# Patient Record
Sex: Female | Born: 1955 | Race: White | Hispanic: No | Smoking: Current every day smoker
Health system: Southern US, Community
[De-identification: ages and names within clinical notes are randomized; demographics above are authoritative.]

## PROBLEM LIST (undated history)

## (undated) HISTORY — PX: VAGINAL HYSTERECTOMY: SUR661

## (undated) HISTORY — PX: CYSTECTOMY: SUR359

## (undated) HISTORY — PX: BLADDER REPAIR: SHX76

---

## 2005-01-20 ENCOUNTER — Ambulatory Visit: Payer: Self-pay | Admitting: Infectious Diseases

## 2005-01-20 ENCOUNTER — Inpatient Hospital Stay (HOSPITAL_COMMUNITY): Admission: AD | Admit: 2005-01-20 | Discharge: 2005-01-25 | Payer: Self-pay | Admitting: Orthopedic Surgery

## 2007-02-06 ENCOUNTER — Encounter (INDEPENDENT_AMBULATORY_CARE_PROVIDER_SITE_OTHER): Payer: Self-pay | Admitting: Orthopedic Surgery

## 2007-02-06 ENCOUNTER — Ambulatory Visit (HOSPITAL_BASED_OUTPATIENT_CLINIC_OR_DEPARTMENT_OTHER): Admission: RE | Admit: 2007-02-06 | Discharge: 2007-02-06 | Payer: Self-pay | Admitting: Orthopedic Surgery

## 2010-11-02 NOTE — Op Note (Signed)
NAME:  Laurie Dean, Laurie Dean                ACCOUNT NO.:  [id number]   MEDICAL RECORD NO.:  [id number]          PATIENT TYPE:  AMB   LOCATION:  [facility]                         FACILITY:  [facility]   PHYSICIAN:  Deidre Ala, M.D.    DATE OF BIRTH:  Mar 09, 1956   DATE OF PROCEDURE:  02/06/2007  DATE OF DISCHARGE:                               OPERATIVE REPORT   PREOPERATIVE DIAGNOSIS:  Large anterolateral foot ganglion cyst.   POSTOPERATIVE DIAGNOSIS:  Large anterolateral foot ganglion cyst, down  to short intrinsics and tendon sheath and the capsule measuring 2 x 3 x  2 cm.   OPERATION:  Excision ganglion cyst, left foot.   SURGEON:  Doristine Section, M.D.   ASSISTANT:  Phineas Semen, P.A.   ANESTHESIA:  General with LMA.   CULTURES:  None.   DRAINS:  None.   ESTIMATED BLOOD LOSS:  Minimal.   TOURNIQUET TIME:  28 minutes.   PATHOLOGIC FINDINGS AND HISTORY:  Laurie Dean has had a complicated  history with this ganglion cyst.  She had it lanced in [place] by Dr.  Douglass Rivers.  It got infected with MRSA.  Dr. Alveda Reasons treated her in our  office with antibiotics in the hospital and it resolved.  The last  clinic note says at some point she may wish to have the lump excised  and that will be strictly up to her there is no sign that this anything  but benign.  When I saw her January 12, 2007 there was a large  transilluminating ganglion cyst and with anterolateral proximal tarsus.  It was firm, very prominent.  It appeared to be a ganglion cyst with no  sign of infection.  The x-rays were negative.  Ganglion cyst measured at  that point 3 x 3 x 2 cm.  We discussed preoperative time-out of work.  She wanted it removed and we told her we would give her IV vancomycin  perioperatively for prophylaxis and doxycycline for short course postop  which she had had in the hospital.  She will probably have to be were  for 7 to 10 days because she walks all the time.  I told her dependent  how the wound edges  healed, this would be set up in the near future.  At  surgery the ganglion was only slightly smaller, extremely tense up in  between the lateral extensor digitorum communis to the fifth toe.  There  were some short intrinsics below it, some muscle and tendon that the  ganglion was coming off.  There was a discrete cystic mass wall,  proximal and distal down to bone with proximal and distal stalk.  We got  the cyst wall out in its entirety and then cauterized the bed to prevent  recurrence.  On entrance with the skin incision there was no  subcutaneous tissue and it was very, very tense.  We encountered a small  rent in the superficial peroneal branch.  The branch itself measured  about 2 mm.  This was a 1 mm, one fascicle that I sutured with 6-0 nylon  just to  prevent neuroma.  I elected not to resect it more proximal and  did this just for prophylaxis for neuroma formation.  Three 6-0 sutures  were placed under loupe magnification with a Castroviejo needle holder  just to reapproximate it again to prevent neuroma formation in this one  fascicle of this very tiny branch.  Epidural suture was done.   PROCEDURE:  With adequate anesthesia obtained using LMA technique, 1  gram vancomycin given IV prophylaxis, the patient was placed in the  supine position.  The left foot was prepped from toes to the midcalf in  the standard fashion.  After standard prepping and draping, Esmarch  examination was used.  The tourniquet was let up to 350 mmHg.  There was  already a transverse incision somewhat into the fibers of the extensor  retinaculum and this was opened obliquely transversely and extended.  Incision was deepened sharply with a knife and hemostasis obtained using  the Bovie electro coagulator.  The ganglion was so superficial and so  tense, it immediately leaked its ganglion fluid.  We expressed it out  and then carefully dissected out the cyst wall down past the lateral  side of the extensor  digitorum to its bed along the short intrinsics and  excised it fully and sent the cyst wall for specimen.  I then cauterized  the bed.  I noted this one fascicle, repaired it and irrigated.  I then  closed the wound with a 4-0 Monocryl subcuticular with Steri-Strips.  0.5% Marcaine plain was injected proximal in a field block and a bulky  sterile compressive dressing was applied with Ace with a wooden sole  fracture shoe.  The patient having tolerated procedure well was  awakened, taken to recovery room in satisfactory condition to be  discharged per outpatient routine, given Percocet for pain and told call  the office for recheck on Monday.  She was to elevate it and continue  doxycycline 100 mg p.o. b.i.d. for 10 days.           ______________________________  V. Charlesetta Shanks, M.D.     VEP/MEDQ  D:  02/06/2007  T:  02/07/2007  Job:  [id number]   cc:   Laurie Severe, MD  Fax: [phone]

## 2010-11-05 NOTE — Discharge Summary (Signed)
Laurie Dean, Laurie Dean                ACCOUNT NO.:  [id number]   MEDICAL RECORD NO.:  [id number]          PATIENT TYPE:  INP   LOCATION:  3027                         FACILITY:  [facility]   PHYSICIAN:  Nelda Severe, MD      DATE OF BIRTH:  [birth year]   DATE OF ADMISSION:  01/20/2005  DATE OF DISCHARGE:  01/25/2005                                 DISCHARGE SUMMARY   This woman was admitted for management of soft tissue infection at the  dorsum of her left foot which had not responded to treatment with oral  antibiotics prior to admission. She originally treated was by a doctor in  [place], [place]. She was admitted and Dr. Ninetta Lights, infectious  disease specialist, consulted. She was placed on vancomycin intravenously.  There was good clinical response. There has never been a positive culture.  At the present time there is no erythema remaining. She still has a small  swollen area, presumably a ganglion cyst which has been in existence for the  last couple of years. At the present time Dr. Ninetta Lights has recommended  discharging her on Bactrim DS one b.i.d. She will be reviewed in the office  in one week's time or sooner if necessary.   FINAL DIAGNOSIS:  Soft tissue infection, left foot.   DISPOSITION:  Home as tolerated with TED hose to secure her dressing.   DISCHARGE MEDICATIONS:  Bactrim DS one b.i.d.   FOLLOWUP:  In the office in one week.   CONDITION ON DISCHARGE:  Improved.      Nelda Severe, MD  Electronically Signed     MT/MEDQ  D:  01/25/2005  T:  01/26/2005  Job:  [id number]

## 2011-04-01 LAB — POCT HEMOGLOBIN-HEMACUE: Operator id: 268271

## 2015-03-11 ENCOUNTER — Ambulatory Visit (INDEPENDENT_AMBULATORY_CARE_PROVIDER_SITE_OTHER): Payer: BLUE CROSS/BLUE SHIELD | Admitting: Osteopathic Medicine

## 2015-03-11 ENCOUNTER — Encounter: Payer: Self-pay | Admitting: Osteopathic Medicine

## 2015-03-11 VITALS — BP 129/78 | HR 87 | Ht 66.0 in | Wt 212.0 lb

## 2015-03-11 DIAGNOSIS — R5382 Chronic fatigue, unspecified: Secondary | ICD-10-CM

## 2015-03-11 DIAGNOSIS — Z720 Tobacco use: Secondary | ICD-10-CM | POA: Diagnosis not present

## 2015-03-11 DIAGNOSIS — Z716 Tobacco abuse counseling: Secondary | ICD-10-CM

## 2015-03-11 DIAGNOSIS — G4710 Hypersomnia, unspecified: Secondary | ICD-10-CM

## 2015-03-11 DIAGNOSIS — Z13220 Encounter for screening for lipoid disorders: Secondary | ICD-10-CM | POA: Insufficient documentation

## 2015-03-11 DIAGNOSIS — R400 Somnolence: Secondary | ICD-10-CM | POA: Insufficient documentation

## 2015-03-11 NOTE — Progress Notes (Signed)
HPI: Laurie Dean is a 59 y.o. female who presents to [facility]  today for chief complaint of:  Chief Complaint  Patient presents with  . Fatigue  . Establish Care    . Quality: generalized fatigue . Severity: moderate . Duration: 1 year . Assoc signs/symptoms: no fever/chills, (+) difficulty waking up, daytime somnolence   STOP BANG (+) snore (+) Tired (+) observed stop breathing when asleep (-) HTN nmp (-) BMI <35 (+) age >81 (-) Neck 14.5 (-) Gender = F 4 = Intermediate risk     Past medical, social and family history reviewed: No past medical history on file. Past Surgical History  Procedure Laterality Date  . Vaginal hysterectomy    . Bladder repair    . Cystectomy     Social History  Substance Use Topics  . Smoking status: Current Every Day Smoker -- 0.50 packs/day    Types: Cigarettes  . Smokeless tobacco: Not on file  . Alcohol Use: 0.6 oz/week    1 Standard drinks or equivalent per week   Family History  Problem Relation Age of Onset  . Cancer Father   . Hypertension Mother   . Stroke Mother   . Stroke Brother     Current Outpatient Prescriptions  Medication Sig Dispense Refill  . Cholecalciferol (VITAMIN D-3 PO) Take by mouth.    . cyanocobalamin 500 MCG tablet Take 500 mcg by mouth daily.    . Multiple Vitamin (MULTIVITAMIN) capsule Take 1 capsule by mouth daily.    Marland Kitchen omeprazole (PRILOSEC) 40 MG capsule Take 40 mg by mouth daily.    . Probiotic Product (PROBIOTIC PO) Take by mouth.     No current facility-administered medications for this visit.   Allergies  Allergen Reactions  . Penicillins   . Sulfa Antibiotics       Review of Systems: CONSTITUTIONAL: Neg fever/chills, no unintentional weight changes (+) fatigue and sleep problems HEAD/EYES/EARS/NOSE/THROAT: No headache/vision change or hearing change, no sore throat CARDIAC: No chest pain/pressure/palpitations, no orthopnea RESPIRATORY: No  cough/shortness of breath/wheeze GASTROINTESTINAL: No nausea/vomiting/abdominal pain/blood in stool/diarrhea/constipation MUSCULOSKELETAL: No myalgia/arthralgia GENITOURINARY: No incontinence, No abnormal genital bleeding/discharge SKIN: No rash/wounds/concerning lesions HEM/ONC: No easy bruising/bleeding, no abnormal lymph node ENDOCRINE: No polyuria/polydipsia/polyphagia, no heat/cold intolerance  NEUROLOGIC: No weakness/dizzines/slurred speech PSYCHIATRIC: No concerns with depression/anxiety   Exam:  BP 129/78 mmHg  Pulse 87  Wt 212 lb (96.163 kg)  SpO2 95% Constitutional: VSS, see above. General Appearance: alert, well-developed, well-nourished, NAD Eyes: Normal lids and conjunctive, non-icteric sclera, PERRLA Ears, Nose, Mouth, Throat: Normal external inspection ears/nares/mouth/lips/gums, Normal TM bilaterally, MMM, posterior pharynx without erythema/exudate Neck: No masses, trachea midline. No thyroid enlargement/tenderness/mass appreciated Respiratory: Normal respiratory effort. no wheeze/rhonchi/rales Cardiovascular: S1/S2 normal, no murmur/rub/gallop auscultated. RRR. No carotid bruit or JVD. No abdominal aortic bruit. Pedal pulse II/IV bilaterally DP and PT. No lower extremity edema. Gastrointestinal: Nontender, no masses. No hepatomegaly, no splenomegaly. No hernia appreciated. Bowel sounds normal. Rectal exam deferred.  Musculoskeletal: Gait normal. No clubbing/cyanosis of digits.  Neurological: No cranial nerve deficit on limited exam. Motor and sensation intact and symmetric Psychiatric: Normal judgment/insight. Normal mood and affect. Oriented x3.    No results found for this or any previous visit (from the past 72 hour(s)).    ASSESSMENT/PLAN:  Chronic fatigue - Plan: Vitamin D (25 hydroxy), CBC with Differential/Platelet, COMPLETE METABOLIC PANEL WITH GFR, TSH, Ambulatory referral to Sleep Studies  Daytime somnolence - Plan: Ambulatory referral to Sleep  Studies  Lipid screening - Plan: Lipid panel  Tobacco abuse disorder  Tobacco abuse counseling  Patient somewhat resistant to getting labs and sleep study done, I advised that labs and sleep study are reasonable to further evaluate her symptoms and she is amenable to these provided insurance will cover them. Also emphasized that patient should make an appointment at her convenience for full discussion of preventive care/annual physical exam. Should return to clinic if not feeling any better or feeling worse. Will call with blood work results when available. Patient to contact us if she has not heard anything about results by the end of the week.

## 2015-03-12 LAB — CBC WITH DIFFERENTIAL/PLATELET
BASOS ABS: 0.1 10*3/uL (ref 0.0–0.1)
BASOS PCT: 1 % (ref 0–1)
EOS ABS: 0.2 10*3/uL (ref 0.0–0.7)
Eosinophils Relative: 3 % (ref 0–5)
HCT: 41.4 % (ref 36.0–46.0)
Hemoglobin: 13.9 g/dL (ref 12.0–15.0)
LYMPHS ABS: 2.1 10*3/uL (ref 0.7–4.0)
Lymphocytes Relative: 35 % (ref 12–46)
MCH: 31.1 pg (ref 26.0–34.0)
MCHC: 33.6 g/dL (ref 30.0–36.0)
MCV: 92.6 fL (ref 78.0–100.0)
MPV: 10.4 fL (ref 8.6–12.4)
Monocytes Absolute: 0.6 10*3/uL (ref 0.1–1.0)
Monocytes Relative: 10 % (ref 3–12)
NEUTROS ABS: 3 10*3/uL (ref 1.7–7.7)
NEUTROS PCT: 51 % (ref 43–77)
PLATELETS: 285 10*3/uL (ref 150–400)
RBC: 4.47 MIL/uL (ref 3.87–5.11)
RDW: 13.6 % (ref 11.5–15.5)
WBC: 5.9 10*3/uL (ref 4.0–10.5)

## 2015-03-12 LAB — LIPID PANEL
Cholesterol: 161 mg/dL (ref 125–200)
HDL: 42 mg/dL — ABNORMAL LOW (ref >=46)
LDL CALC: 101 mg/dL (ref <130)
Total CHOL/HDL Ratio: 3.8 Ratio (ref <=5.0)
Triglycerides: 89 mg/dL (ref <150)
VLDL: 18 mg/dL (ref <30)

## 2015-03-12 LAB — COMPLETE METABOLIC PANEL WITH GFR
ALT: 21 U/L (ref 6–29)
AST: 23 U/L (ref 10–35)
Albumin: 4 g/dL (ref 3.6–5.1)
Alkaline Phosphatase: 70 U/L (ref 33–130)
BILIRUBIN TOTAL: 0.7 mg/dL (ref 0.2–1.2)
BUN: 16 mg/dL (ref 7–25)
CO2: 27 mmol/L (ref 20–31)
CREATININE: 1.08 mg/dL — AB (ref 0.50–0.99)
Calcium: 9.3 mg/dL (ref 8.6–10.4)
Chloride: 104 mmol/L (ref 98–110)
GFR, Est African American: 64 mL/min (ref >=60)
GFR, Est Non African American: 56 mL/min — ABNORMAL LOW (ref >=60)
GLUCOSE: 81 mg/dL (ref 65–99)
Potassium: 4.7 mmol/L (ref 3.5–5.3)
SODIUM: 141 mmol/L (ref 135–146)
TOTAL PROTEIN: 6.7 g/dL (ref 6.1–8.1)

## 2015-03-13 LAB — TSH: TSH: 1.975 u[IU]/mL (ref 0.350–4.500)

## 2015-03-13 LAB — VITAMIN D 25 HYDROXY (VIT D DEFICIENCY, FRACTURES): VIT D 25 HYDROXY: 73 ng/mL (ref 30–100)

## 2016-01-20 ENCOUNTER — Encounter: Payer: Self-pay | Admitting: Sports Medicine

## 2016-01-20 ENCOUNTER — Ambulatory Visit (INDEPENDENT_AMBULATORY_CARE_PROVIDER_SITE_OTHER): Payer: BLUE CROSS/BLUE SHIELD | Admitting: Sports Medicine

## 2016-01-20 ENCOUNTER — Ambulatory Visit (INDEPENDENT_AMBULATORY_CARE_PROVIDER_SITE_OTHER): Payer: BLUE CROSS/BLUE SHIELD

## 2016-01-20 DIAGNOSIS — L723 Sebaceous cyst: Secondary | ICD-10-CM

## 2016-01-20 DIAGNOSIS — M79602 Pain in left arm: Secondary | ICD-10-CM | POA: Diagnosis not present

## 2016-01-20 DIAGNOSIS — M21932 Unspecified acquired deformity of left forearm: Secondary | ICD-10-CM | POA: Diagnosis not present

## 2016-01-20 DIAGNOSIS — M50322 Other cervical disc degeneration at C5-C6 level: Secondary | ICD-10-CM

## 2016-01-20 MED ORDER — MELOXICAM 15 MG PO TABS: ORAL_TABLET | ORAL | 3 refills | Status: AC

## 2016-01-20 NOTE — Assessment & Plan Note (Signed)
Suspect radiculopathy with pain in the entire left arm. She does have a history of a displaced distal radius fracture, but doesn't have much pain at the joint. X-rays of the neck, wrist, meloxicam. She is going to see a chiropractor. Return to see me in one month for this.

## 2016-01-20 NOTE — Progress Notes (Signed)
   Subjective:    I'm seeing this patient as a consultation for:  Dr. Sunnie Nielsen  CC: Left arm pain  HPI: Years ago this pleasant 60 year old female had a displaced distal radius fracture and is post closed reduction, since then she's had pain that she localizes along the entire arm without paresthesias into the hands or fingers, and no discrete pain over the fracture or the distal radius itself. Symptoms are moderate, persistent.  Sebaceous cyst: Middle of the neck, desires surgical excision  Past medical history, Surgical history, Family history not pertinant except as noted below, Social history, Allergies, and medications have been entered into the medical record, reviewed, and no changes needed.   Review of Systems: No headache, visual changes, nausea, vomiting, diarrhea, constipation, dizziness, abdominal pain, skin rash, fevers, chills, night sweats, weight loss, swollen lymph nodes, body aches, joint swelling, muscle aches, chest pain, shortness of breath, mood changes, visual or auditory hallucinations.   Objective:   General: Well Developed, well nourished, and in no acute distress.  Neuro/Psych: Alert and oriented x3, extra-ocular muscles intact, able to move all 4 extremities, sensation grossly intact. Skin: Warm and dry, no rashes noted. There is a 3 cm sebaceous cyst in the middle of the back of the neck Respiratory: Not using accessory muscles, speaking in full sentences, trachea midline.  Cardiovascular: Pulses palpable, no extremity edema. Abdomen: Does not appear distended. Left Wrist: Slight dinner fork deformity still present, no tenderness over the radiocarpal joint ROM smooth and normal with good flexion and extension and ulnar/radial deviation that is symmetrical with opposite wrist. Palpation is normal over metacarpals, navicular, lunate, and TFCC; tendons without tenderness/ swelling No snuffbox tenderness. No tenderness over Canal of Guyon. Strength 5/5  in all directions without pain. Negative Finkelstein, tinel's and phalens. Negative Watson's test. Left Elbow: Unremarkable to inspection. Range of motion full pronation, supination, flexion, extension. Strength is full to all of the above directions Stable to varus, valgus stress. Negative moving valgus stress test. No discrete areas of tenderness to palpation. Ulnar nerve does not sublux. Negative cubital tunnel Tinel's.  Impression and Recommendations:   This case required medical decision making of moderate complexity.  Sebaceous cyst Patient will return for surgical excision  Left arm pain Suspect radiculopathy with pain in the entire left arm. She does have a history of a displaced distal radius fracture, but doesn't have much pain at the joint. X-rays of the neck, wrist, meloxicam. She is going to see a chiropractor. Return to see me in one month for this.

## 2016-01-20 NOTE — Assessment & Plan Note (Signed)
Patient will return for surgical excision. 

## 2016-01-27 ENCOUNTER — Ambulatory Visit: Payer: BLUE CROSS/BLUE SHIELD | Admitting: Sports Medicine

## 2016-02-03 ENCOUNTER — Encounter: Payer: Self-pay | Admitting: Sports Medicine

## 2016-02-03 ENCOUNTER — Ambulatory Visit (INDEPENDENT_AMBULATORY_CARE_PROVIDER_SITE_OTHER): Payer: Self-pay | Admitting: Sports Medicine

## 2016-02-03 DIAGNOSIS — L723 Sebaceous cyst: Secondary | ICD-10-CM

## 2016-02-03 NOTE — Assessment & Plan Note (Signed)
Surgical excision of sebaceous cyst. Cyst was 3 cm across. I placed 2 simple interrupted and 1 horizontal mattress Ethilon sutures, she will return in one week for suture removal.

## 2016-02-03 NOTE — Progress Notes (Signed)
  Procedure:  Excision of  upper back sebaceous cyst, 3 cm Risks, benefits, and alternatives explained and consent obtained. Time out conducted. Surface prepped with alcohol. 5cc lidocaine with epinephine infiltrated in a field block. Adequate anesthesia ensured. Area prepped and draped in a sterile fashion. Excision performed with: Using a #11 blade I made a linear incision, then using both sharp and blunt dissection I encircled and excised the cyst, some of the inner caseous material was expressed, and in the cyst capsule was removed in its entirety with dissection carried out to subcutaneous tissue. I then placed a single 3-0 Ethilon horizontal mattress suture, and 2 simple interrupted 3-0 Ethilon sutures on either side. Hemostasis achieved. Pt stable.

## 2016-02-10 ENCOUNTER — Encounter: Payer: Self-pay | Admitting: Sports Medicine

## 2016-02-10 ENCOUNTER — Ambulatory Visit (INDEPENDENT_AMBULATORY_CARE_PROVIDER_SITE_OTHER): Payer: Self-pay | Admitting: Sports Medicine

## 2016-02-10 DIAGNOSIS — L723 Sebaceous cyst: Secondary | ICD-10-CM

## 2016-02-10 NOTE — Assessment & Plan Note (Signed)
Doing well, sutures removed, return as needed. 

## 2016-02-10 NOTE — Progress Notes (Signed)
  Subjective:  1 week post sebaceous cyst excision, doing well  Objective: General: Well-developed, well-nourished, and in no acute distress. Back: Incision is clean, dry, intact, all sutures removed and Dermabond applied. No signs of bacterial infection.  Assessment/plan:   No problem-specific Assessment & Plan notes found for this encounter.

## 2016-04-28 ENCOUNTER — Ambulatory Visit (INDEPENDENT_AMBULATORY_CARE_PROVIDER_SITE_OTHER): Payer: Self-pay | Admitting: Family Medicine

## 2016-04-28 ENCOUNTER — Encounter: Payer: Self-pay | Admitting: Family Medicine

## 2016-04-28 VITALS — BP 118/76 | HR 73 | Wt 209.0 lb

## 2016-04-28 DIAGNOSIS — R42 Dizziness and giddiness: Secondary | ICD-10-CM

## 2016-04-28 NOTE — Progress Notes (Signed)
       Laurie FetterFreida Dean is a 60 y.o. female who [facility] today for dizziness. Patient has a one-day history of mild dizziness. She describes a swimmy headed sensation. She denies any vertigo syncope chest pain palpitations. She denies any loss of function or vision. She notes she is feeling a bit better today. She notes that she has not been eating regularly and not been checking much water recently.   No past medical history on file. Past Surgical History:  Procedure Laterality Date  . BLADDER REPAIR    . CYSTECTOMY    . VAGINAL HYSTERECTOMY     Social History  Substance Use Topics  . Smoking status: Current Every Day Smoker    Packs/day: 0.50    Types: Cigarettes  . Smokeless tobacco: Not on file  . Alcohol use 0.6 oz/week    1 Standard drinks or equivalent per week   family history includes Cancer in her father; Hypertension in her mother; Stroke in her brother and mother.  ROS as above:  Medications: Current Outpatient Prescriptions  Medication Sig Dispense Refill  . Cholecalciferol (VITAMIN D-3 PO) Take by mouth.    . cyanocobalamin 500 MCG tablet Take 500 mcg by mouth daily.    . meloxicam (MOBIC) 15 MG tablet One tab PO qAM with breakfast for 2 weeks, then daily prn pain. 30 tablet 3  . Multiple Vitamin (MULTIVITAMIN) capsule Take 1 capsule by mouth daily.    Marland Kitchen. omeprazole (PRILOSEC) 40 MG capsule Take 40 mg by mouth daily.    . Probiotic Product (PROBIOTIC PO) Take by mouth.     No current facility-administered medications for this visit.    Allergies  Allergen Reactions  . Penicillins   . Sulfa Antibiotics     Health Maintenance Health Maintenance  Topic Date Due  . Hepatitis C Screening  Apr 10, 1956  . HIV Screening  11/01/1969  . TETANUS/TDAP  11/01/1973  . PAP SMEAR  11/02/1975  . MAMMOGRAM  11/01/2004  . COLONOSCOPY  11/01/2004    . ZOSTAVAX  11/02/2014  . INFLUENZA VACCINE  01/19/2016     Exam:  BP 118/76   Pulse 73   Wt 209 lb (94.8 kg)   BMI 33.73 kg/m   Orthostatic VS for the past 24 hrs:  BP- Lying Pulse- Lying BP- Sitting Pulse- Sitting BP- Standing at 0 minutes Pulse- Standing at 0 minutes  04/28/16 1343 136/81 81 118/76 73 117/77 85      Gen: Well NAD Nontoxic appearing HEENT: EOMI,  MMM no bruit normal tympanic membranes bilaterally Lungs: Normal work of breathing. CTABL Heart: RRR no MRG Abd: NABS, Soft. Nondistended, Nontender Exts: Brisk capillary refill, warm and well perfused.  Neuro: Alert and oriented normal coordination balance and gait. Negative Dix-Hallpike test bilaterally.   No results found for this or any previous visit (from the past 72 hour(s)). No results found.    Assessment and Plan: 60 y.o. female with dizzy. Suspect presyncope. Doubt for further vestibular issue. Patient appears to be well. Plan for watchful waiting as well as any fluids and salt. Return as needed.  Patient additionally continues to complain of chronic fatigue. Recommend sleep study in the new year.   No orders of the defined types were placed in this encounter.   Discussed warning signs or symptoms. Please see discharge instructions. Patient expresses understanding.

## 2016-04-28 NOTE — Patient Instructions (Signed)
Thank you for coming in today. Drink plenty of fluids and eat some salt.  Take it easy.  Return if not better.  I recommend getting a sleep study in the new year.    Near-Syncope Near-syncope (commonly known as near fainting) is sudden weakness, dizziness, or feeling like you might pass out. During an episode of near-syncope, you may also develop pale skin, have tunnel vision, or feel sick to your stomach (nauseous). Near-syncope may occur when getting up after sitting or while standing for a long time. It is caused by a sudden decrease in blood flow to the brain. This decrease can result from various causes or triggers, most of which are not serious. However, because near-syncope can sometimes be a sign of something serious, a medical evaluation is required. The specific cause is often not determined. HOME CARE INSTRUCTIONS  Monitor your condition for any changes. The following actions may help to alleviate any discomfort you are experiencing:  Have someone stay with you until you feel stable.  Lie down right away and prop your feet up if you start feeling like you might faint. Breathe deeply and steadily. Wait until all the symptoms have passed. Most of these episodes last only a few minutes. You may feel tired for several hours.   Drink enough fluids to keep your urine clear or pale yellow.   If you are taking blood pressure or heart medicine, get up slowly when seated or lying down. Take several minutes to sit and then stand. This can reduce dizziness.  Follow up with your health care provider as directed. SEEK IMMEDIATE MEDICAL CARE IF:   You have a severe headache.   You have unusual pain in the chest, abdomen, or back.   You are bleeding from the mouth or rectum, or you have black or tarry stool.   You have an irregular or very fast heartbeat.   You have repeated fainting or have seizure-like jerking during an episode.   You faint when sitting or lying down.   You  have confusion.   You have difficulty walking.   You have severe weakness.   You have vision problems.  MAKE SURE YOU:   Understand these instructions.  Will watch your condition.  Will get help right away if you are not doing well or get worse.   This information is not intended to replace advice given to you by your health care provider. Make sure you discuss any questions you have with your health care provider.   Document Released: 06/06/2005 Document Revised: 06/11/2013 Document Reviewed: 11/09/2012 Elsevier Interactive Patient Education [employer]2016 [employer] Inc[employer]

## 2017-04-29 IMAGING — DX DG CERVICAL SPINE COMPLETE 4+V
5 series · 5 of 5 positions shown · non-contrast
Comparison: None.

CLINICAL DATA: Sporadic right arm numbness, no known injury

EXAM:
CERVICAL SPINE - COMPLETE 4+ VIEW

[c-spine lat]
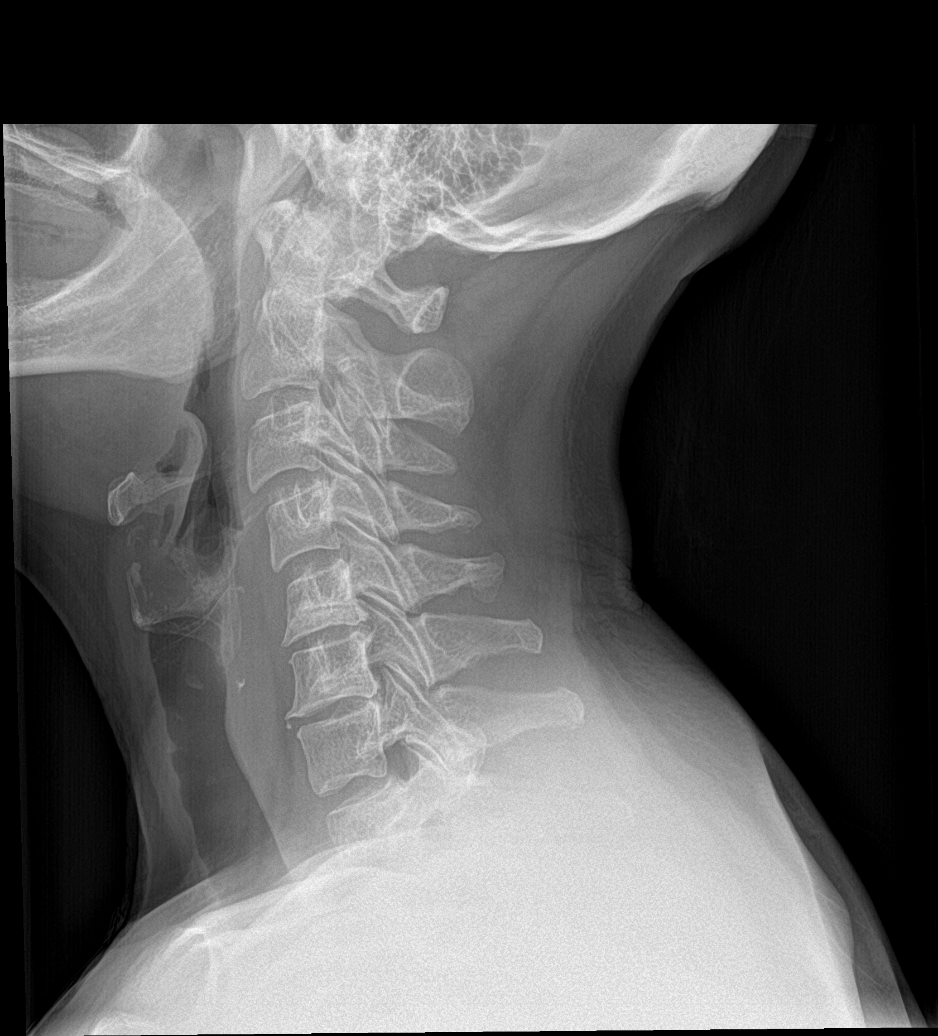

[c-spine obl (1 of 2)]
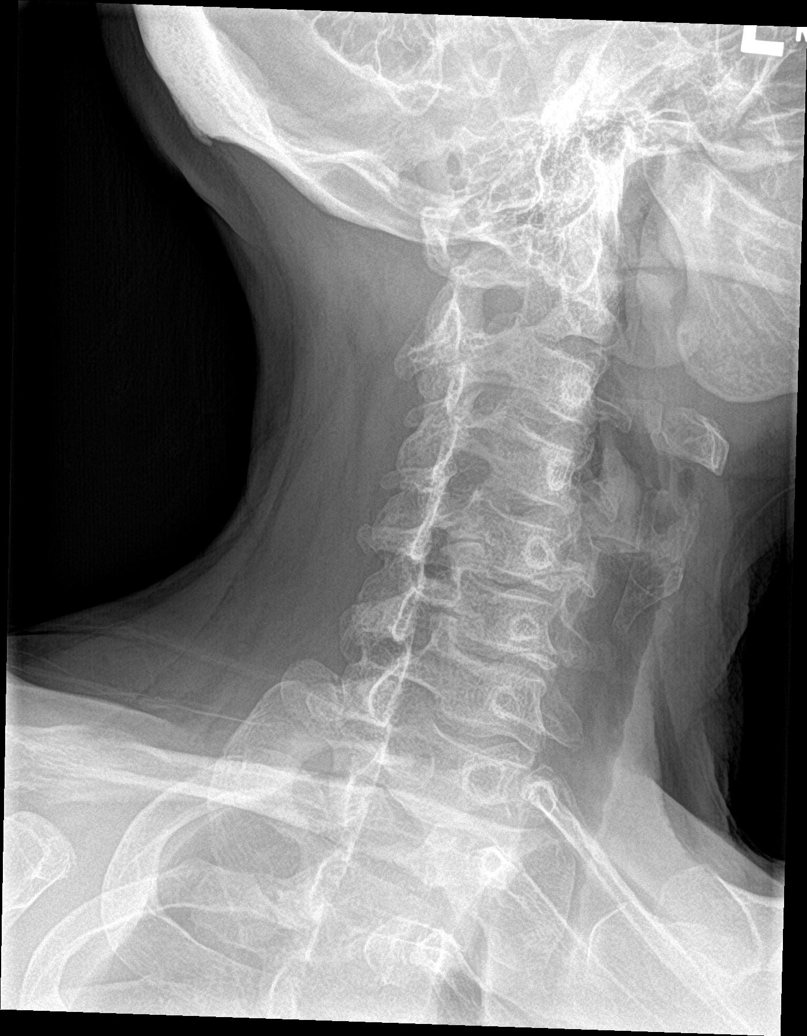

[c-spine obl (2 of 2)]
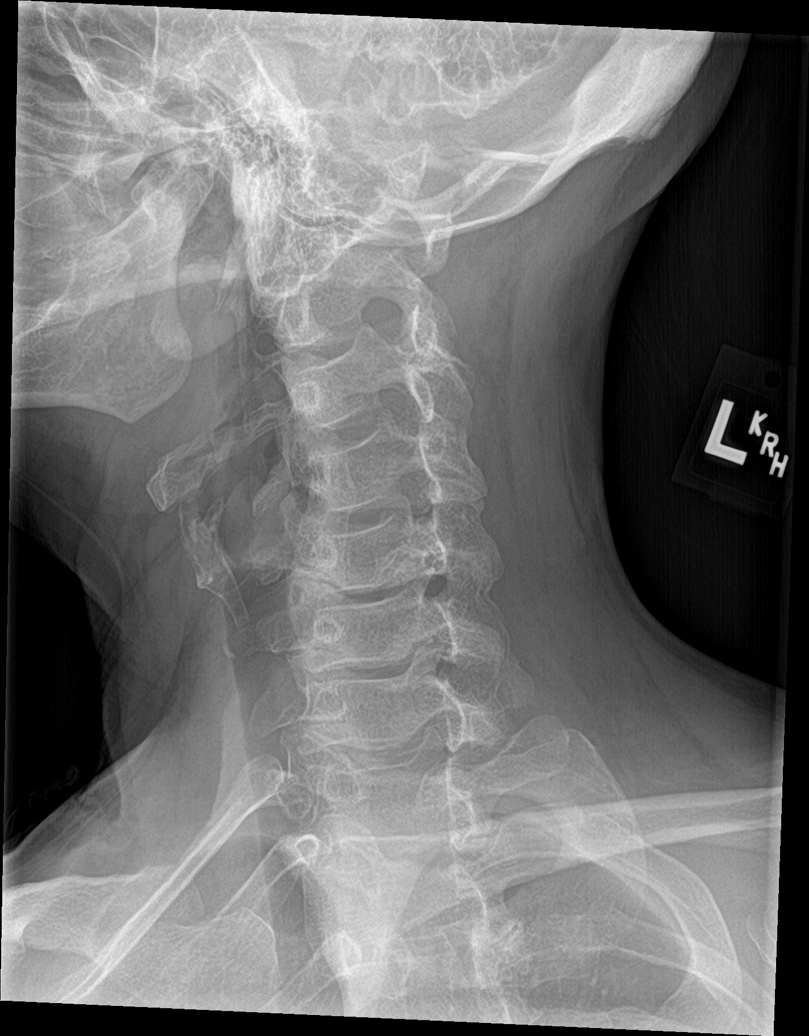

[c-spine ap]
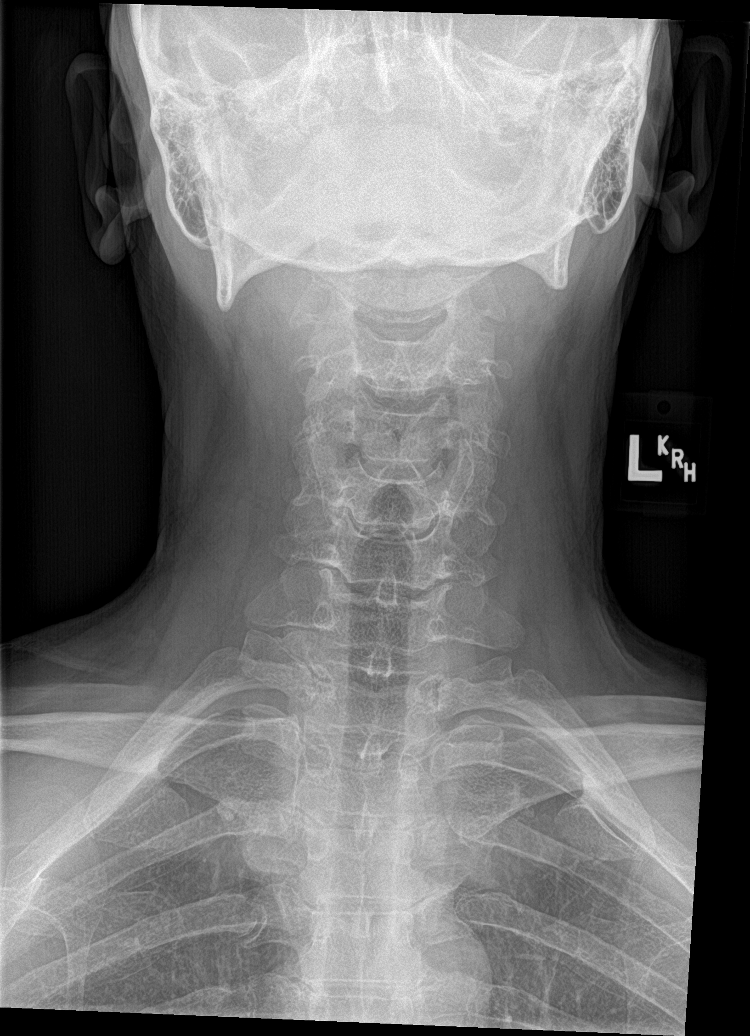

[c-spine open mouth]
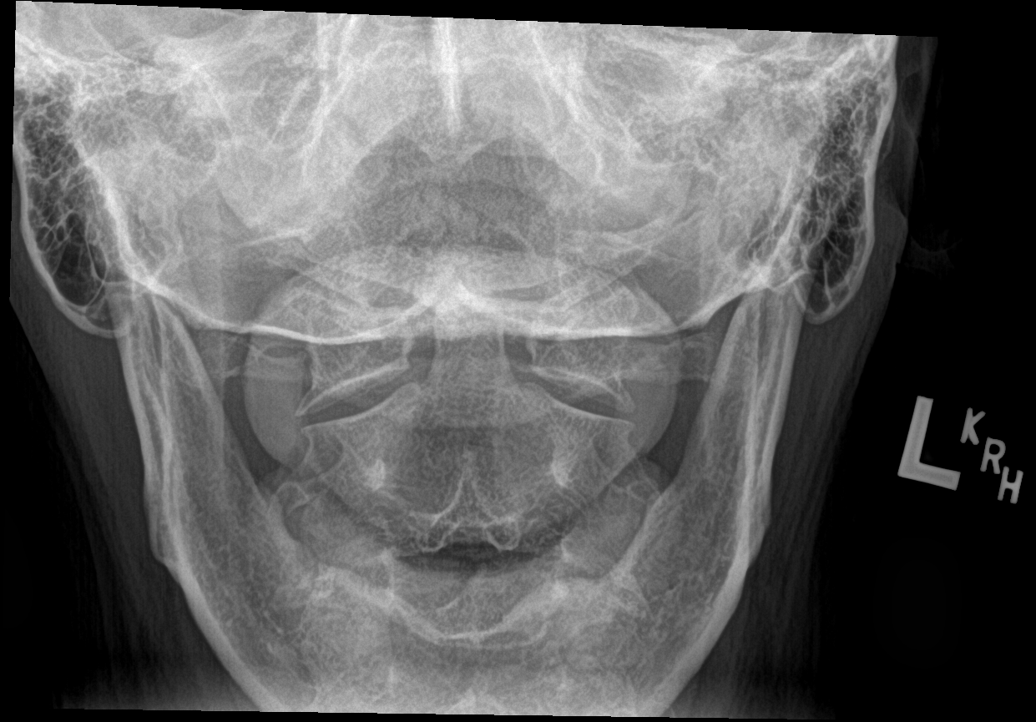

[5 of 5 positions shown; findings below may reference images not displayed]

FINDINGS: The cervical vertebrae are straightened in alignment. There is
degenerative disc disease at C5-6 and C6-7. At these levels there is
loss of disc space with sclerosis and spurring present. No
prevertebral soft tissue swelling is seen. On oblique views there is
foraminal narrowing of a moderate degree at these levels
bilaterally, greater at C5-6. The odontoid process is intact. The
lung apices are clear.
IMPRESSION: 1. Straightened alignment with degenerative disc disease at C5-6 and
C6-7.
2. Foraminal narrowing at C5-6 and C6-7, greater at C5-6.
